# Patient Record
Sex: Male | Born: 1970
Health system: Southern US, Community
[De-identification: ages and names within clinical notes are randomized; demographics above are authoritative.]

## PROBLEM LIST (undated history)

## (undated) HISTORY — PX: CYST EXCISION: SHX5701

---

## 2007-06-04 ENCOUNTER — Emergency Department (HOSPITAL_COMMUNITY): Admission: EM | Admit: 2007-06-04 | Discharge: 2007-06-05 | Payer: Self-pay | Admitting: Emergency Medicine

## 2007-08-30 ENCOUNTER — Ambulatory Visit (HOSPITAL_COMMUNITY): Admission: RE | Admit: 2007-08-30 | Discharge: 2007-08-30 | Payer: Self-pay | Admitting: Internal Medicine

## 2007-09-02 ENCOUNTER — Ambulatory Visit (HOSPITAL_COMMUNITY): Admission: RE | Admit: 2007-09-02 | Discharge: 2007-09-02 | Payer: Self-pay | Admitting: Internal Medicine

## 2008-11-02 IMAGING — CT CT CHEST W/ CM
1 series · 15 of 33 positions shown, 19 images · IV contrast (Omnipaque 300)
Comparison: none

HISTORY: Cough, smoker

[Series 2: chestroutine 5.0 b40f · axial · 0.67mm/px · z∈[-416,-110]mm · 15 of 73 slices shown, 19 images]
[im 6/73  mediastinal]
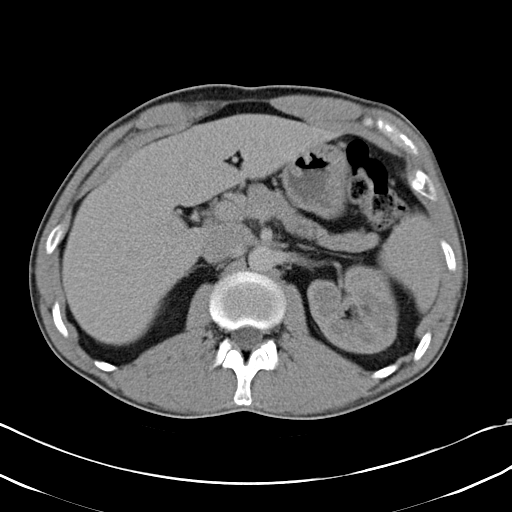
[im 6/73  lung]
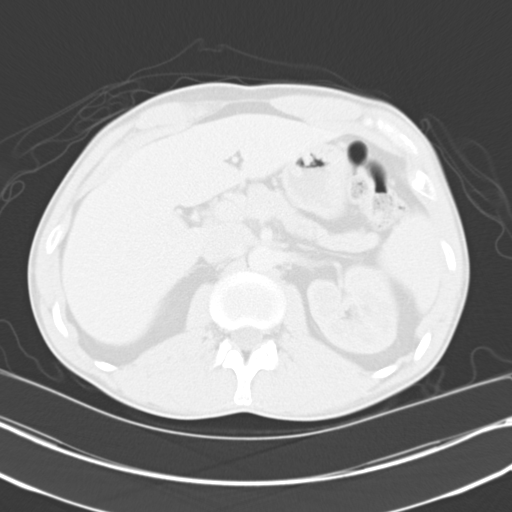
[im 11/73  lung]
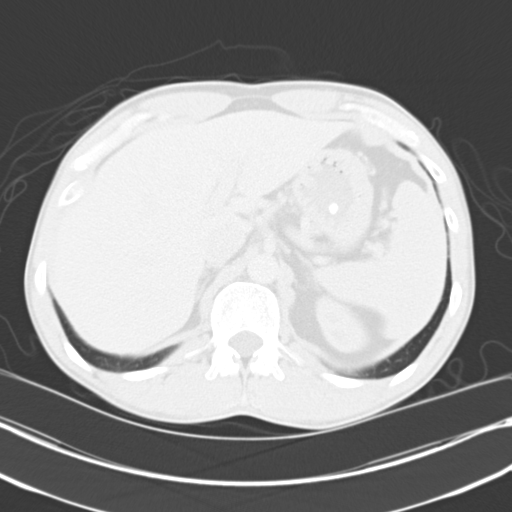
[im 15/73  lung]
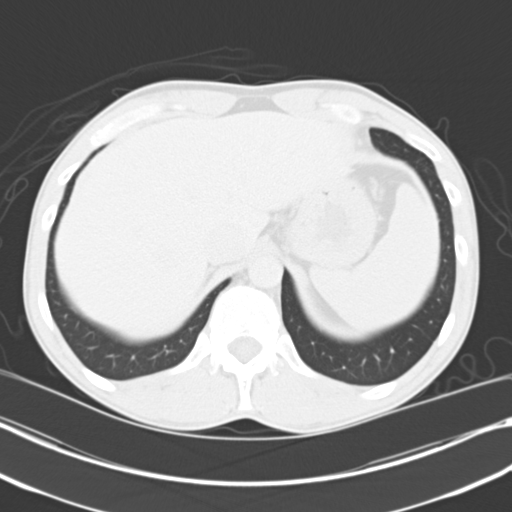
[im 19/73  lung]
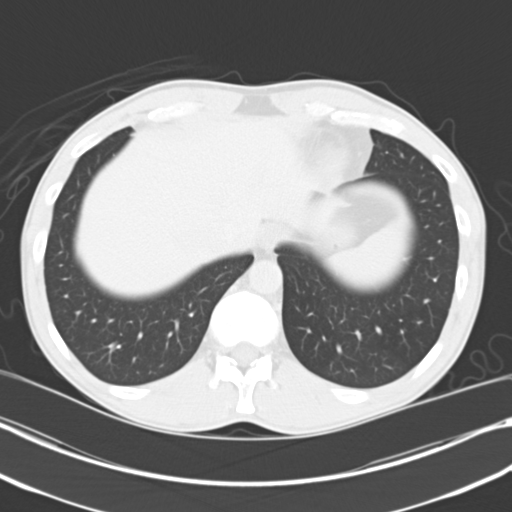
[im 25/73  mediastinal]
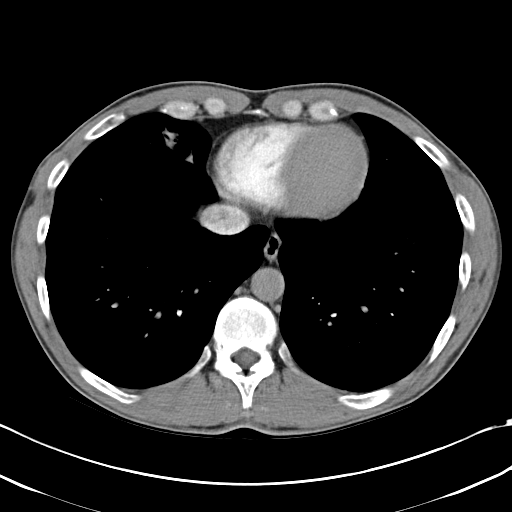
[im 25/73  lung]
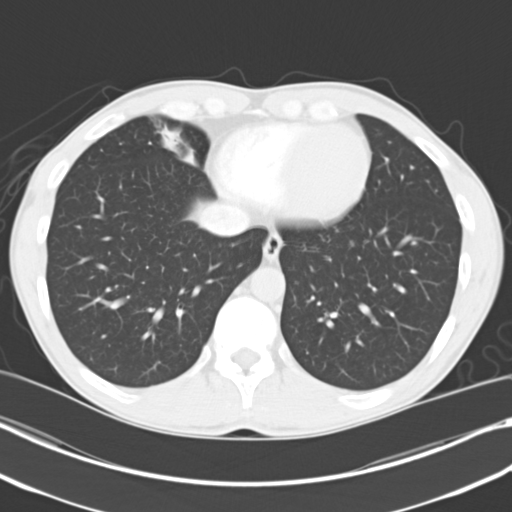
[im 29/73  lung]
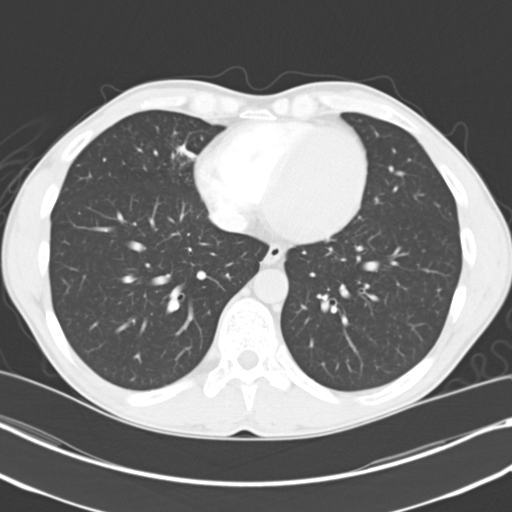
[im 33/73  lung]
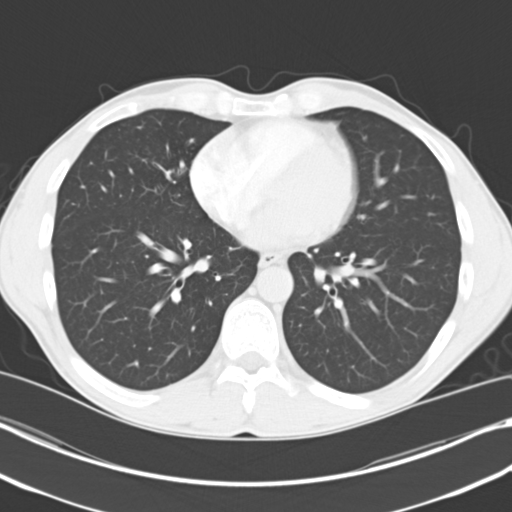
[im 38/73  lung]
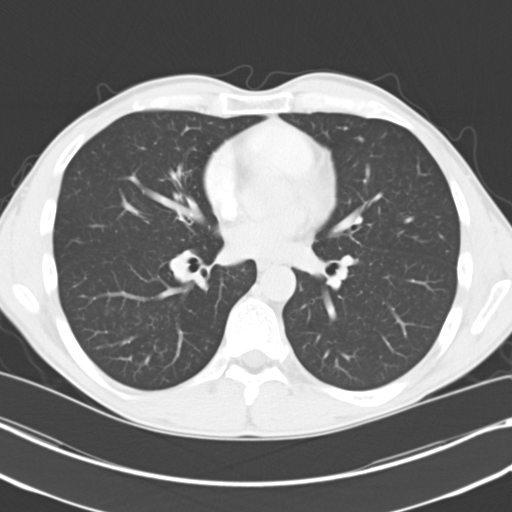
[im 41/73  mediastinal]
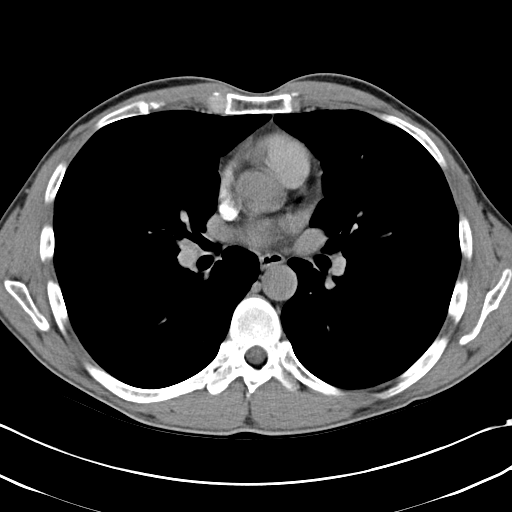
[im 41/73  lung]
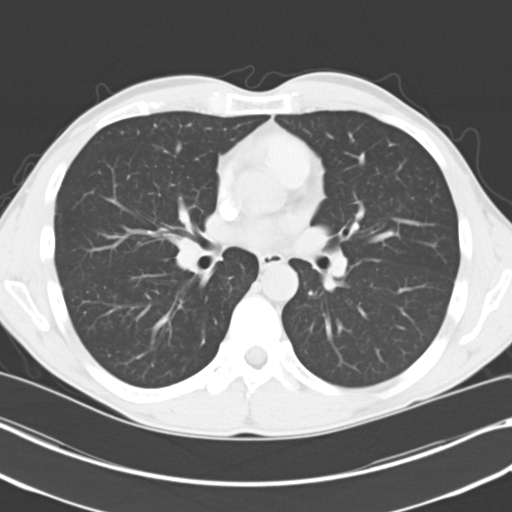
[im 44/73  lung]
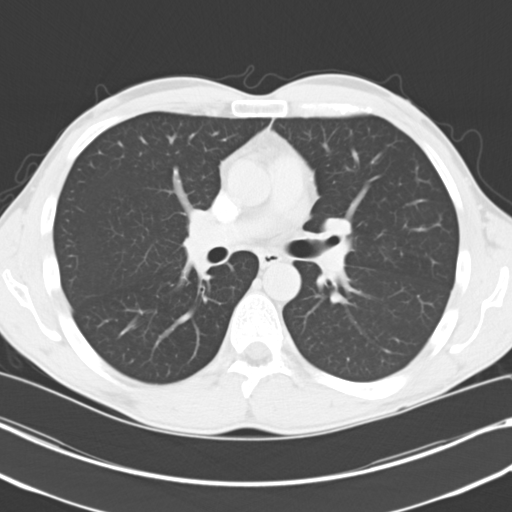
[im 49/73  lung]
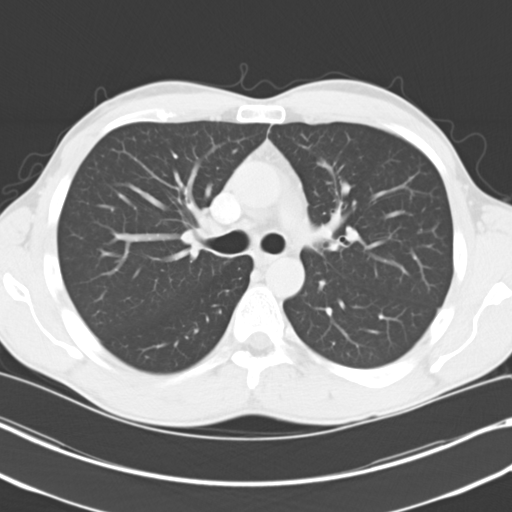
[im 54/73  lung]
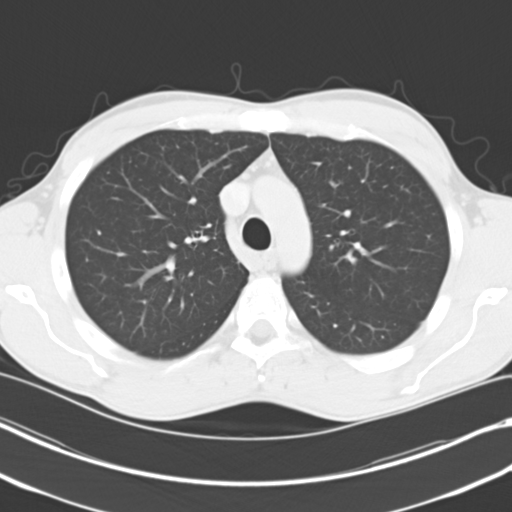
[im 58/73  mediastinal]
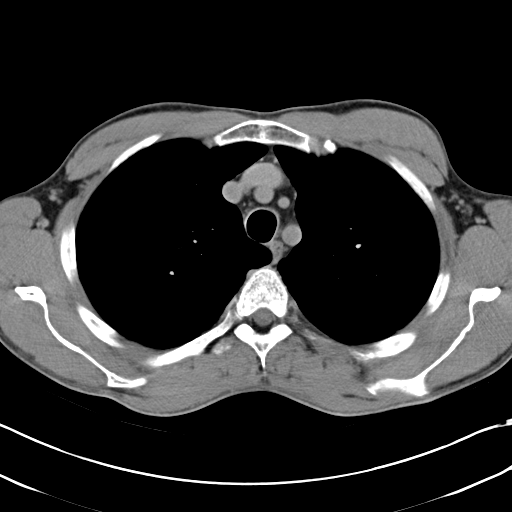
[im 58/73  lung]
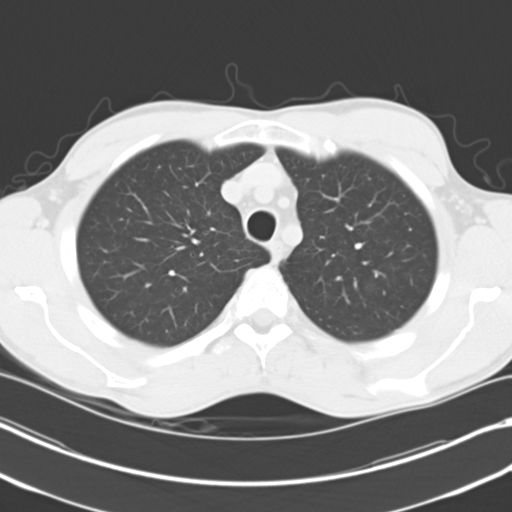
[im 62/73  lung]
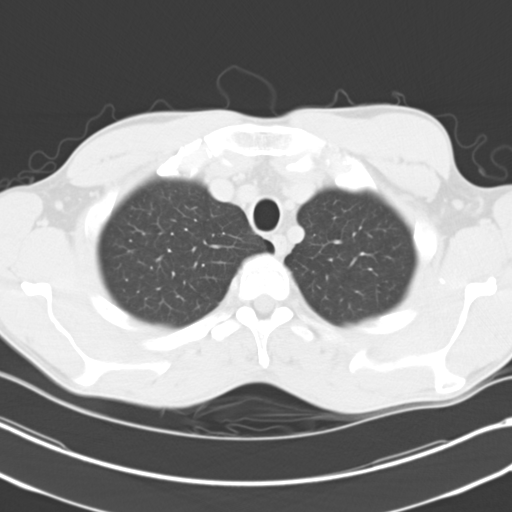
[im 67/73  lung]
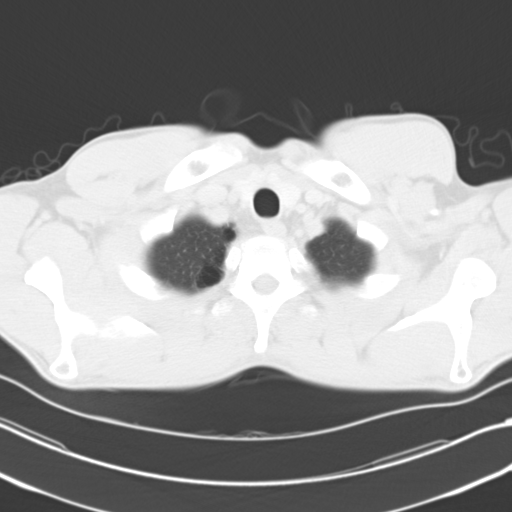

[15 of 33 positions shown; findings below may reference images not displayed]

CT CHEST WITH CONTRAST:

Multidetector helical CT chest performed following 80 cc Xmnipaque-V77.
Sagittal and coronal images reconstructed from axial data set.
Contrast enhancement of thoracic vascular structures is suboptimal.
No contrast extravasation at injection site in left upper extremity.
I suspect the suboptimal contrast enhancement is related to mis-timing of
scanning versus contrast injection, scanning prior to optimal contrast delivery.
No prior exam for comparison. 

Aorta normal caliber.
No enlarged thoracic nodes.
Tiny nonspecific low attenuation focus anterior aspect liver image 54, too small
to characterize.
Tiny cyst posterior aspect right lobe liver 7 mm diameter image 70.
Remaining visualized portion of upper abdomen unremarkable.
Mild atelectasis in medial segment of base of right middle lobe.
Remaining lungs clear.
Bones unremarkable.
IMPRESSION: Minimal infiltrate versus atelectasis medial segment right middle lobe.
Tiny hepatic cyst with additional tiny nonspecific low attenuation focus liver.
No other thoracic abnormalities.

## 2011-11-21 ENCOUNTER — Other Ambulatory Visit (HOSPITAL_COMMUNITY): Payer: Self-pay | Admitting: Orthopaedic Surgery

## 2011-11-21 DIAGNOSIS — R531 Weakness: Secondary | ICD-10-CM

## 2011-11-21 DIAGNOSIS — M25562 Pain in left knee: Secondary | ICD-10-CM

## 2011-11-25 ENCOUNTER — Other Ambulatory Visit (HOSPITAL_COMMUNITY): Payer: Self-pay

## 2013-04-28 ENCOUNTER — Ambulatory Visit (HOSPITAL_COMMUNITY)
Admission: RE | Admit: 2013-04-28 | Discharge: 2013-04-28 | Disposition: A | Discharge status: Home / Self Care | Payer: Managed Care, Other (non HMO) | Source: Ambulatory Visit | Attending: Physician Assistant | Admitting: Physician Assistant

## 2013-04-28 ENCOUNTER — Other Ambulatory Visit (HOSPITAL_COMMUNITY): Payer: Self-pay | Admitting: Physician Assistant

## 2013-04-28 DIAGNOSIS — M25562 Pain in left knee: Secondary | ICD-10-CM

## 2013-04-28 DIAGNOSIS — M8560 Other cyst of bone, unspecified site: Secondary | ICD-10-CM | POA: Insufficient documentation

## 2013-04-28 DIAGNOSIS — M25469 Effusion, unspecified knee: Secondary | ICD-10-CM | POA: Insufficient documentation

## 2013-04-28 DIAGNOSIS — M23329 Other meniscus derangements, posterior horn of medial meniscus, unspecified knee: Secondary | ICD-10-CM | POA: Insufficient documentation

## 2013-04-28 DIAGNOSIS — M21869 Other specified acquired deformities of unspecified lower leg: Secondary | ICD-10-CM

## 2013-07-28 ENCOUNTER — Encounter (HOSPITAL_COMMUNITY): Payer: Self-pay | Admitting: Pharmacy Technician

## 2013-07-28 ENCOUNTER — Encounter (HOSPITAL_COMMUNITY): Payer: Self-pay

## 2013-07-28 ENCOUNTER — Encounter (HOSPITAL_COMMUNITY)
Admission: RE | Admit: 2013-07-28 | Discharge: 2013-07-28 | Disposition: A | Discharge status: Home / Self Care | Payer: Worker's Compensation | Source: Ambulatory Visit | Attending: General Surgery | Admitting: General Surgery

## 2013-07-28 DIAGNOSIS — Z01812 Encounter for preprocedural laboratory examination: Secondary | ICD-10-CM | POA: Insufficient documentation

## 2013-07-28 LAB — CBC
HCT: 43 % (ref 39.0–52.0)
MCHC: 35.3 g/dL (ref 30.0–36.0)
MCV: 92.9 fL (ref 78.0–100.0)
WBC: 8.7 10*3/uL (ref 4.0–10.5)

## 2013-07-28 LAB — BASIC METABOLIC PANEL
BUN: 18 mg/dL (ref 6–23)
Calcium: 10 mg/dL (ref 8.4–10.5)
Chloride: 100 mEq/L (ref 96–112)
Creatinine, Ser: 1.06 mg/dL (ref 0.50–1.35)
Potassium: 4.3 mEq/L (ref 3.5–5.1)

## 2013-07-28 NOTE — Patient Instructions (Signed)
Your procedure is scheduled on: 07/29/2013  Report to [facility] at  8:45   AM.  Call this number if you have problems the morning of surgery: [phone]   Remember:   Do not drink or eat food:After Midnight.  :  Take these medicines the morning of surgery with A SIP OF WATER:    Do not wear jewelry, make-up or nail polish.  Do not wear lotions, powders, or perfumes. You may wear deodorant.  Do not shave 48 hours prior to surgery. Men may shave face and neck.  Do not bring valuables to the hospital.  Contacts, dentures or bridgework may not be worn into surgery.  Leave suitcase in the car. After surgery it may be brought to your room.  For patients admitted to the hospital, checkout time is 11:00 AM the day of discharge.   Patients discharged the day of surgery will not be allowed to drive home.    Special Instructions: Shower using CHG 2 nights before surgery and the night before surgery.  If you shower the day of surgery use CHG.  Use special wash - you have one bottle of CHG for all showers.  You should use approximately 1/3 of the bottle for each shower.   Please read over the following fact sheets that you were given: Pain Booklet, MRSA Information, Surgical Site Infection Prevention and Care and Recovery After Surgery  Hernia Repair Care After These instructions give you information on caring for yourself after your procedure. Your doctor may also give you more specific instructions. Call your doctor if you have any problems or questions after your procedure. HOME CARE   You may have changes in your poops (bowel movements).  You may have loose or watery poop (diarrhea).  You may be not able to poop.  Your bowels will slowly get back to normal.  Do not eat any food that makes you sick to your stomach (nauseous). Eat small meals 4 to 6 times a day instead of 3 large ones.  Do not drink pop. It will give you gas.  Do not drink alcohol.  Do not lift anything heavier than  10 pounds. This is about the weight of a gallon of milk.  Do not do anything that makes you very tired for at least 6 weeks.  Do not get your wound wet for 2 days.  You may take a sponge bath during this time.  After 2 days you may take a shower. Gently pat your surgical cut (incision) dry with a towel. Do not rub it.  For men: You may have been given an athletic supporter (scrotal support) before you left the hospital. It holds your scrotum and testicles closer to your body so there is no strain on your wound. Wear the supporter until your doctor tells you that you do not need it anymore. GET HELP RIGHT AWAY IF:  You have watery poop, or cannot poop for more than 3 days.  You feel sick to your stomach or throw up (vomit) more than 2 or 3 times.  You have temperature by mouth above 102 F (38.9 C).  You see redness or puffiness (swelling) around your wound.  You see yellowish white fluid (pus) coming from your wound.  You see a bulge or bump in your lower belly (abdomen) or near your groin.  You develop a rash, trouble breathing, or any other symptoms from medicines taken. MAKE SURE YOU:  Understand these instructions.  Will watch your condition.  Will get help right away if your are not doing well or get worse. Document Released: 09/11/2008 Document Revised: 12/22/2011 Document Reviewed: 09/11/2008 [facility] Patient Information 2014 [place], [place]. PATIENT INSTRUCTIONS POST-ANESTHESIA  IMMEDIATELY FOLLOWING SURGERY:  Do not drive or operate machinery for the first twenty four hours after surgery.  Do not make any important decisions for twenty four hours after surgery or while taking narcotic pain medications or sedatives.  If you develop intractable nausea and vomiting or a severe headache please notify your doctor immediately.  FOLLOW-UP:  Please make an appointment with your surgeon as instructed. You do not need to follow up with anesthesia unless specifically instructed  to do so.  WOUND CARE INSTRUCTIONS (if applicable):  Keep a dry clean dressing on the anesthesia/puncture wound site if there is drainage.  Once the wound has quit draining you may leave it open to air.  Generally you should leave the bandage intact for twenty four hours unless there is drainage.  If the epidural site drains for more than 36-48 hours please call the anesthesia department.  QUESTIONS?:  Please feel free to call your physician or the hospital operator if you have any questions, and they will be happy to assist you.

## 2013-08-04 ENCOUNTER — Encounter (HOSPITAL_COMMUNITY): Payer: Self-pay

## 2013-08-04 ENCOUNTER — Encounter (HOSPITAL_COMMUNITY)
Admission: RE | Admit: 2013-08-04 | Discharge: 2013-08-04 | Disposition: A | Discharge status: Home / Self Care | Payer: Managed Care, Other (non HMO) | Source: Ambulatory Visit | Attending: General Surgery | Admitting: General Surgery

## 2013-08-10 ENCOUNTER — Encounter (HOSPITAL_COMMUNITY): Payer: Worker's Compensation | Admitting: Anesthesiology

## 2013-08-10 ENCOUNTER — Ambulatory Visit (HOSPITAL_COMMUNITY): Payer: Worker's Compensation | Admitting: Anesthesiology

## 2013-08-10 ENCOUNTER — Encounter (HOSPITAL_COMMUNITY)
Admission: RE | Disposition: A | Discharge status: Home / Self Care | Payer: Self-pay | Source: Ambulatory Visit | Attending: General Surgery

## 2013-08-10 ENCOUNTER — Encounter (HOSPITAL_COMMUNITY): Payer: Self-pay | Admitting: *Deleted

## 2013-08-10 ENCOUNTER — Ambulatory Visit (HOSPITAL_COMMUNITY)
Admission: RE | Admit: 2013-08-10 | Discharge: 2013-08-10 | Disposition: A | Discharge status: Home / Self Care | Payer: Worker's Compensation | Source: Ambulatory Visit | Attending: General Surgery | Admitting: General Surgery

## 2013-08-10 DIAGNOSIS — K4090 Unilateral inguinal hernia, without obstruction or gangrene, not specified as recurrent: Secondary | ICD-10-CM | POA: Insufficient documentation

## 2013-08-10 HISTORY — PX: INGUINAL HERNIA REPAIR: SHX194

## 2013-08-10 HISTORY — PX: INSERTION OF MESH: SHX5868

## 2013-08-10 SURGERY — REPAIR, HERNIA, INGUINAL, ADULT
Anesthesia: General | Site: Groin | Laterality: Left | Wound class: Clean

## 2013-08-10 MED ORDER — BUPIVACAINE LIPOSOME 1.3 % IJ SUSP
20.0000 mL | Freq: Once | INTRAMUSCULAR | Status: AC
  Administered 2013-08-10: 10 mL
  Filled 2013-08-10: qty 20

## 2013-08-10 MED ORDER — CLINDAMYCIN PHOSPHATE 900 MG/50ML IV SOLN: 900.0000 mg | Freq: Once | INTRAVENOUS | Status: DC

## 2013-08-10 MED ORDER — MIDAZOLAM HCL 2 MG/2ML IJ SOLN
INTRAMUSCULAR | Status: AC
  Filled 2013-08-10: qty 2

## 2013-08-10 MED ORDER — FENTANYL CITRATE 0.05 MG/ML IJ SOLN
25.0000 ug | INTRAMUSCULAR | Status: DC | PRN
  Administered 2013-08-10 (×2): 50 ug via INTRAVENOUS
  Filled 2013-08-10: qty 2

## 2013-08-10 MED ORDER — LIDOCAINE HCL 1 % IJ SOLN
INTRAMUSCULAR | Status: DC | PRN
  Administered 2013-08-10: 50 mg via INTRADERMAL

## 2013-08-10 MED ORDER — FENTANYL CITRATE 0.05 MG/ML IJ SOLN
INTRAMUSCULAR | Status: DC | PRN
  Administered 2013-08-10 (×3): 50 ug via INTRAVENOUS
  Administered 2013-08-10: 100 ug via INTRAVENOUS

## 2013-08-10 MED ORDER — BUPIVACAINE HCL (PF) 0.5 % IJ SOLN
INTRAMUSCULAR | Status: AC
  Filled 2013-08-10: qty 30

## 2013-08-10 MED ORDER — PROPOFOL 10 MG/ML IV BOLUS
INTRAVENOUS | Status: DC | PRN
  Administered 2013-08-10: 200 mg via INTRAVENOUS

## 2013-08-10 MED ORDER — MEPERIDINE HCL 50 MG/ML IJ SOLN
INTRAMUSCULAR | Status: AC
  Administered 2013-08-10: 12.5 mg
  Filled 2013-08-10: qty 1

## 2013-08-10 MED ORDER — KETOROLAC TROMETHAMINE 30 MG/ML IJ SOLN
30.0000 mg | Freq: Once | INTRAMUSCULAR | Status: AC
  Administered 2013-08-10: 30 mg via INTRAVENOUS
  Filled 2013-08-10: qty 1

## 2013-08-10 MED ORDER — MEPERIDINE HCL 25 MG/ML IJ SOLN
12.5000 mg | Freq: Once | INTRAMUSCULAR | Status: AC
  Administered 2013-08-10: 12.5 mg via INTRAVENOUS

## 2013-08-10 MED ORDER — MIDAZOLAM HCL 2 MG/2ML IJ SOLN
1.0000 mg | INTRAMUSCULAR | Status: AC | PRN
  Administered 2013-08-10 (×3): 2 mg via INTRAVENOUS
  Filled 2013-08-10 (×2): qty 2

## 2013-08-10 MED ORDER — LIDOCAINE HCL (PF) 1 % IJ SOLN
INTRAMUSCULAR | Status: AC
  Filled 2013-08-10: qty 5

## 2013-08-10 MED ORDER — FENTANYL CITRATE 0.05 MG/ML IJ SOLN
25.0000 ug | INTRAMUSCULAR | Status: AC
  Administered 2013-08-10 (×2): 25 ug via INTRAVENOUS
  Filled 2013-08-10: qty 2

## 2013-08-10 MED ORDER — ONDANSETRON HCL 4 MG/2ML IJ SOLN
4.0000 mg | Freq: Once | INTRAMUSCULAR | Status: AC
  Administered 2013-08-10: 4 mg via INTRAVENOUS
  Filled 2013-08-10: qty 2

## 2013-08-10 MED ORDER — CLINDAMYCIN PHOSPHATE 900 MG/50ML IV SOLN
900.0000 mg | Freq: Once | INTRAVENOUS | Status: AC
  Administered 2013-08-10: 900 mg via INTRAVENOUS

## 2013-08-10 MED ORDER — CLINDAMYCIN PHOSPHATE 900 MG/50ML IV SOLN
INTRAVENOUS | Status: AC
  Filled 2013-08-10: qty 50

## 2013-08-10 MED ORDER — OXYCODONE-ACETAMINOPHEN 7.5-325 MG PO TABS: 1.0000 | ORAL_TABLET | ORAL | Status: DC | PRN

## 2013-08-10 MED ORDER — LACTATED RINGERS IV SOLN
INTRAVENOUS | Status: DC
  Administered 2013-08-10: 12:00:00 via INTRAVENOUS
  Administered 2013-08-10: 1000 mL via INTRAVENOUS

## 2013-08-10 MED ORDER — SODIUM CHLORIDE 0.9 % IR SOLN
Status: DC | PRN
  Administered 2013-08-10: 500 mL

## 2013-08-10 MED ORDER — PROPOFOL 10 MG/ML IV BOLUS
INTRAVENOUS | Status: AC
  Filled 2013-08-10: qty 20

## 2013-08-10 MED ORDER — ONDANSETRON HCL 4 MG/2ML IJ SOLN: 4.0000 mg | Freq: Once | INTRAMUSCULAR | Status: DC | PRN

## 2013-08-10 MED ORDER — FENTANYL CITRATE 0.05 MG/ML IJ SOLN
INTRAMUSCULAR | Status: AC
  Filled 2013-08-10: qty 5

## 2013-08-10 SURGICAL SUPPLY — 39 items
ADH SKN CLS APL DERMABOND .7 (GAUZE/BANDAGES/DRESSINGS) ×1
BAG HAMPER (MISCELLANEOUS) ×2 IMPLANT
CLOTH BEACON ORANGE TIMEOUT ST (SAFETY) ×2 IMPLANT
COVER LIGHT HANDLE STERIS (MISCELLANEOUS) ×4 IMPLANT
DECANTER SPIKE VIAL GLASS SM (MISCELLANEOUS) ×2 IMPLANT
DERMABOND ADVANCED (GAUZE/BANDAGES/DRESSINGS) ×1
DERMABOND ADVANCED .7 DNX12 (GAUZE/BANDAGES/DRESSINGS) ×1 IMPLANT
DRAIN PENROSE 3/4X12 (DRAIN) ×2 IMPLANT
ELECT REM PT RETURN 9FT ADLT (ELECTROSURGICAL) ×2
ELECTRODE REM PT RTRN 9FT ADLT (ELECTROSURGICAL) ×1 IMPLANT
FORMALIN 10 PREFIL 120ML (MISCELLANEOUS) IMPLANT
GLOVE BIO SURGEON STRL SZ7.5 (GLOVE) ×2 IMPLANT
GLOVE ECLIPSE 6.5 STRL STRAW (GLOVE) ×1 IMPLANT
GLOVE EXAM NITRILE MD LF STRL (GLOVE) ×1 IMPLANT
GLOVE INDICATOR 7.0 STRL GRN (GLOVE) ×2 IMPLANT
GLOVE SS BIOGEL STRL SZ 6.5 (GLOVE) IMPLANT
GLOVE SUPERSENSE BIOGEL SZ 6.5 (GLOVE) ×1
GOWN STRL REIN XL XLG (GOWN DISPOSABLE) ×6 IMPLANT
INST SET MINOR GENERAL (KITS) ×2 IMPLANT
KIT ROOM TURNOVER APOR (KITS) ×2 IMPLANT
MANIFOLD NEPTUNE II (INSTRUMENTS) ×2 IMPLANT
MESH MARLEX PLUG MEDIUM (Mesh General) ×1 IMPLANT
NS IRRIG 1000ML POUR BTL (IV SOLUTION) ×2 IMPLANT
PACK MINOR (CUSTOM PROCEDURE TRAY) ×2 IMPLANT
PAD ARMBOARD 7.5X6 YLW CONV (MISCELLANEOUS) ×2 IMPLANT
SET BASIN LINEN APH (SET/KITS/TRAYS/PACK) ×2 IMPLANT
SOL PREP PROV IODINE SCRUB 4OZ (MISCELLANEOUS) ×2 IMPLANT
SUT NOVA NAB GS-22 2 2-0 T-19 (SUTURE) ×3 IMPLANT
SUT NOVAFIL NAB HGS22 2-0 30IN (SUTURE) IMPLANT
SUT SILK 3 0 (SUTURE)
SUT SILK 3-0 18XBRD TIE 12 (SUTURE) IMPLANT
SUT VIC AB 2-0 CT1 27 (SUTURE) ×2
SUT VIC AB 2-0 CT1 TAPERPNT 27 (SUTURE) ×1 IMPLANT
SUT VIC AB 3-0 SH 27 (SUTURE) ×2
SUT VIC AB 3-0 SH 27X BRD (SUTURE) ×1 IMPLANT
SUT VIC AB 4-0 PS2 27 (SUTURE) ×2 IMPLANT
SUT VICRYL AB 3 0 TIES (SUTURE) IMPLANT
SYR CONTROL 10ML LL (SYRINGE) ×2 IMPLANT
TOWEL OR 17X26 4PK STRL BLUE (TOWEL DISPOSABLE) ×2 IMPLANT

## 2013-08-10 NOTE — Op Note (Signed)
Patient:  Sean Meadows  DOB:  April 30, 1971  MRN:  [id number]   Preop Diagnosis:  Left inguinal hernia  Postop Diagnosis:  Same  Procedure:  Left inguinal herniorrhaphy  Surgeon:  Franky Macho, M.D.  Anes:  General  Indications:  Patient is a 42 year old white male who presents with a symptomatic left inguinal hernia. The risks and benefits of the procedure including bleeding, infection, and the possibility of recurrence of the hernia were fully explained to the patient, who gave informed consent.  Procedure note:  The patient is placed in the supine position. After general anesthesia was a minister, the left groin region was prepped and draped using usual sterile technique with Betadine. Surgical site confirmation was performed.  A transverse incision was made in the left groin region down to the external oblique aponeuroses. The process was incised to the external ring. A Penrose drain was placed around the spermatic cord. A positive for this was noted to the spermatic cord. The patient was also noted have significant enlarged varicoceles present. The patient did have an indirect hernia sac. This was freed away from the spermatic cord up to the perineal reflection and inverted. A medium size PerFix plug was then inserted. An onlay patch was then placed along the floor of inguinal canal and secured superiorly to the conjoined tendon and inferiorly to the shelving edge of Poupart's ligament using 2-0 Novafil interrupted sutures. The internal ring was recreated using a 2-0 Novafil interrupted suture. The external oblique aponeuroses was reapproximated using a 2-0 Vicryl running suture. The subcutaneous layer was reapproximated using 3-0 Vicryl interrupted suture. The skin was closed a 4-0 Vicryl subcuticular suture. Exparel was instilled in the surrounding wound. Dermabond was then applied.  All tape and needle counts were correct at the end of the procedure. The patient was awakened and  transferred to PACU in stable condition.  Complications:  None  EBL:  Minimal  Specimen:  None

## 2013-08-10 NOTE — Transfer of Care (Signed)
Immediate Anesthesia Transfer of Care Note  Patient: Sean Meadows  Procedure(s) Performed: Procedure(s): HERNIA REPAIR INGUINAL ADULT (Left) INSERTION OF MESH (Left)  Patient Location: PACU  Anesthesia Type:General  Level of Consciousness: awake  Airway & Oxygen Therapy: Patient Spontanous Breathing and Patient connected to face mask oxygen  Post-op Assessment: Report given to PACU RN and Post -op Vital signs reviewed and stable  Post vital signs: Reviewed and stable  Complications: No apparent anesthesia complications

## 2013-08-10 NOTE — Interval H&P Note (Signed)
History and Physical Interval Note:  08/10/2013 11:50 AM  Sean Meadows  has presented today for surgery, with the diagnosis of left inguinal hernia  The various methods of treatment have been discussed with the patient and family. After consideration of risks, benefits and other options for treatment, the patient has consented to  Procedure(s): HERNIA REPAIR INGUINAL ADULT (Left) as a surgical intervention .  The patient's history has been reviewed, patient examined, no change in status, stable for surgery.  I have reviewed the patient's chart and labs.  Questions were answered to the patient's satisfaction.     Franky Macho A

## 2013-08-10 NOTE — Preoperative (Signed)
Beta Blockers   Reason not to administer Beta Blockers:Not Applicable 

## 2013-08-10 NOTE — H&P (Signed)
Dr. Franky Macho will be performing the left inguinal herniorrhaphy. Dr. Helyn App. available. I have seen the patient preoperatively and he agrees to the treatment plan. The risks and benefits of the procedure were fully explained to the patient, who gave informed consent.

## 2013-08-10 NOTE — Anesthesia Postprocedure Evaluation (Signed)
  Anesthesia Post-op Note  Patient: Sean Meadows  Procedure(s) Performed: Procedure(s): HERNIA REPAIR INGUINAL ADULT (Left) INSERTION OF MESH (Left)  Patient Location: PACU  Anesthesia Type:General  Level of Consciousness: awake and alert   Airway and Oxygen Therapy: Patient Spontanous Breathing and Patient connected to face mask oxygen  Post-op Pain: none  Post-op Assessment: Post-op Vital signs reviewed, Patient's Cardiovascular Status Stable, Respiratory Function Stable, Patent Airway and No signs of Nausea or vomiting  Post-op Vital Signs: Reviewed and stable  Complications: No apparent anesthesia complications

## 2013-08-10 NOTE — Anesthesia Preprocedure Evaluation (Signed)
Anesthesia Evaluation  Patient identified by MRN, date of birth, ID band Patient awake    Reviewed: Allergy & Precautions, H&P , NPO status , Patient's Chart, lab work & pertinent test results  Airway Mallampati: I TM Distance: >3 FB Neck ROM: Full    Dental  (+) Teeth Intact   Pulmonary neg pulmonary ROS, Current Smoker,  breath sounds clear to auscultation        Cardiovascular negative cardio ROS  Rhythm:Regular Rate:Normal     Neuro/Psych negative psych ROS   GI/Hepatic negative GI ROS,   Endo/Other    Renal/GU      Musculoskeletal   Abdominal   Peds  Hematology   Anesthesia Other Findings   Reproductive/Obstetrics                           Anesthesia Physical Anesthesia Plan  ASA: I  Anesthesia Plan: General   Post-op Pain Management:    Induction: Intravenous  Airway Management Planned: LMA  Additional Equipment:   Intra-op Plan:   Post-operative Plan: Extubation in OR  Informed Consent: I have reviewed the patients History and Physical, chart, labs and discussed the procedure including the risks, benefits and alternatives for the proposed anesthesia with the patient or authorized representative who has indicated his/her understanding and acceptance.     Plan Discussed with:   Anesthesia Plan Comments:         Anesthesia Quick Evaluation

## 2013-08-12 ENCOUNTER — Encounter (HOSPITAL_COMMUNITY): Payer: Self-pay | Admitting: General Surgery

## 2013-08-12 NOTE — H&P (Signed)
NTS SOAP Note  Vital Signs:  Vitals as of: 07/21/2013: Systolic 123: Diastolic 66: Heart Rate 83: Temp 99.71F: Height 25ft 2in: Weight 169Lbs 0 Ounces: Pain Level 2: BMI 21.7  BMI : 21.7 kg/m2  Subjective: This 42 Years 12 Months old Male presents for of  discomfort and bulge in the left groin. Patient noted approximately 2 weeks ago while working a pulling sensation in the groin. After that time he noted the bulging. He denies any prior issues with bulging or discomfort. She denies any tearing or popping sensation. No change in bowel habits. No nausea. No signs or symptoms of incarceration or strangulation.  Review of Symptoms:  Constitutional:unremarkable   Head:unremarkable    Eyes:unremarkable   Nose/Mouth/Throat:unremarkable Cardiovascular:  unremarkable   Respiratory:unremarkable   Gastrointestinal:  unremarkable   As per history of present illness Genitourinary:unremarkable     Musculoskeletal:unremarkable   Skin:unremarkable Breast:unremarkable   Hematolgic/Lymphatic:unremarkable     Allergic/Immunologic:unremarkable     Past Medical History:  Obtained     Past Medical History  Surgical History: none Medical Problems: none Psychiatric History: none Allergies: no known drug allergies Medications: none   Social History:Obtained  Social History  Preferred Language: English Race:  White Ethnicity: Not Hispanic / Latino Age: 42 Years 0 Months Marital Status:  S Alcohol: none Recreational drug(s): denies   Smoking Status: Current every day smoker reviewed on 07/22/2013 Started Date: 10/13/1990 Packs per day: 1.00 Functional Status reviewed on mm/dd/yyyy ------------------------------------------------ Bathing: Normal Cooking: Normal Dressing: Normal Driving: Normal Eating: Normal Managing Meds: Normal Oral Care: Normal Shopping: Normal Toileting: Normal Transferring: Normal Walking: Normal Cognitive Status  reviewed on mm/dd/yyyy ------------------------------------------------ Attention: Normal Decision Making: Normal Language: Normal Memory: Normal Motor: Normal Perception: Normal Problem Solving: Normal Visual and Spatial: Normal   Family History:Obtained    Family Health History Mother  Father, Living; Healthy; cancer    Objective Information: General:  Well appearing, well nourished in no distress. Skin:     no rash or prominent lesions Head:Atraumatic; no masses; no abnormalities Eyes:  conjunctiva clear, EOM intact, PERRL Mouth:  Mucous membranes moist, no mucosal lesions. Neck:  Supple without lymphadenopathy.  Heart:  RRR, no murmur Lungs:    CTA bilaterally, no wheezes, rhonchi, rales.  Breathing unlabored. Abdomen:Soft, NT/ND, no HSM, no masses.   reducible left inguinal hernia.    Normal external male genitalia bilateral descended testicles.  Assessment:    Plan:  Left angle hernia. Risks benefits alternatives of care have been discussed. Signs and symptoms of incarceration have been discussed as well patient understands that should any symptoms occur he should proceed to the emergency department. Scheduling will be based upon patient and patient's employer's convenience.  Patient Education:Alternative treatments to surgery were discussed with patient (and family).  Risks and benefits  of procedure were fully explained to the patient (and family) who gave informed consent. Patient/family questions were addressed.  Follow-up:Pending Surgery

## 2013-09-23 ENCOUNTER — Other Ambulatory Visit (HOSPITAL_COMMUNITY): Payer: Self-pay | Admitting: Family Medicine

## 2013-09-23 ENCOUNTER — Ambulatory Visit (HOSPITAL_COMMUNITY)
Admission: RE | Admit: 2013-09-23 | Discharge: 2013-09-23 | Disposition: A | Discharge status: Home / Self Care | Payer: Managed Care, Other (non HMO) | Source: Ambulatory Visit | Attending: Family Medicine | Admitting: Family Medicine

## 2013-09-23 DIAGNOSIS — M545 Low back pain, unspecified: Secondary | ICD-10-CM | POA: Insufficient documentation

## 2017-10-02 DIAGNOSIS — J069 Acute upper respiratory infection, unspecified: Secondary | ICD-10-CM | POA: Diagnosis not present

## 2017-10-02 DIAGNOSIS — Z6822 Body mass index (BMI) 22.0-22.9, adult: Secondary | ICD-10-CM | POA: Diagnosis not present

## 2017-10-02 DIAGNOSIS — Z719 Counseling, unspecified: Secondary | ICD-10-CM | POA: Diagnosis not present

## 2017-11-11 DIAGNOSIS — Z6822 Body mass index (BMI) 22.0-22.9, adult: Secondary | ICD-10-CM | POA: Diagnosis not present

## 2017-11-11 DIAGNOSIS — J301 Allergic rhinitis due to pollen: Secondary | ICD-10-CM | POA: Diagnosis not present

## 2017-11-11 DIAGNOSIS — J019 Acute sinusitis, unspecified: Secondary | ICD-10-CM | POA: Diagnosis not present

## 2018-07-23 DIAGNOSIS — L723 Sebaceous cyst: Secondary | ICD-10-CM | POA: Diagnosis not present

## 2018-07-23 DIAGNOSIS — D229 Melanocytic nevi, unspecified: Secondary | ICD-10-CM | POA: Diagnosis not present

## 2020-06-30 ENCOUNTER — Emergency Department (HOSPITAL_COMMUNITY)
Admission: EM | Admit: 2020-06-30 | Discharge: 2020-06-30 | Disposition: A | Discharge status: Home / Self Care | Payer: No Typology Code available for payment source | Attending: Emergency Medicine | Admitting: Emergency Medicine

## 2020-06-30 ENCOUNTER — Ambulatory Visit (INDEPENDENT_AMBULATORY_CARE_PROVIDER_SITE_OTHER): Payer: No Typology Code available for payment source

## 2020-06-30 ENCOUNTER — Other Ambulatory Visit: Payer: Self-pay

## 2020-06-30 ENCOUNTER — Encounter (HOSPITAL_COMMUNITY): Payer: Self-pay | Admitting: Emergency Medicine

## 2020-06-30 ENCOUNTER — Ambulatory Visit
Admission: EM | Admit: 2020-06-30 | Discharge: 2020-06-30 | Disposition: A | Discharge status: Home / Self Care | Payer: 59 | Attending: Emergency Medicine | Admitting: Emergency Medicine

## 2020-06-30 DIAGNOSIS — S6991XA Unspecified injury of right wrist, hand and finger(s), initial encounter: Secondary | ICD-10-CM

## 2020-06-30 DIAGNOSIS — S61111A Laceration without foreign body of right thumb with damage to nail, initial encounter: Secondary | ICD-10-CM

## 2020-06-30 DIAGNOSIS — F17210 Nicotine dependence, cigarettes, uncomplicated: Secondary | ICD-10-CM | POA: Insufficient documentation

## 2020-06-30 DIAGNOSIS — S61319A Laceration without foreign body of unspecified finger with damage to nail, initial encounter: Secondary | ICD-10-CM

## 2020-06-30 DIAGNOSIS — W231XXA Caught, crushed, jammed, or pinched between stationary objects, initial encounter: Secondary | ICD-10-CM | POA: Insufficient documentation

## 2020-06-30 MED ORDER — LIDOCAINE HCL (PF) 1 % IJ SOLN
30.0000 mL | Freq: Once | INTRAMUSCULAR | Status: AC
  Administered 2020-06-30: 30 mL
  Filled 2020-06-30: qty 30

## 2020-06-30 NOTE — ED Notes (Signed)
Pt discharge instructions reviewed with the patient. The patient verbalized understanding of instructions. Pt discharged. 

## 2020-06-30 NOTE — ED Triage Notes (Signed)
Pt sent from [facility] in [place] for nailbed laceration to R thumb.  States he smashed it in a machine at work around [place] completed at [facility].  Coban dressing in place.

## 2020-06-30 NOTE — Discharge Instructions (Signed)
X-rays negative for fracture However, based on location of laceration to nail bed I am recommending further evaluation and management in the ED.  Patient aware and in agreement with plan.  Will go by private vehicle.

## 2020-06-30 NOTE — ED Provider Notes (Signed)
[facility]   [id number] 06/30/20 Arrival Time: 57  CC: LACERATION  SUBJECTIVE:  Sean Meadows is a 49 y.o. male who presents with a laceration to RT thumb that occurred today.  Symptoms began after smashing thumb in cigarette machine.  Bleeding controlled.  Denies similar symptoms in the past.  Denies fever, chills, nausea, vomiting, swelling.    Td UTD: Yes.  ROS: As per HPI.  All other pertinent ROS negative.     History reviewed. No pertinent past medical history. Past Surgical History:  Procedure Laterality Date  . CYST EXCISION     from lower groin  . INGUINAL HERNIA REPAIR Left 08/10/2013   Procedure: HERNIA REPAIR INGUINAL ADULT;  Surgeon: Jamesetta So, MD;  Location: [facility];  Service: General;  Laterality: Left;  . INSERTION OF MESH Left 08/10/2013   Procedure: INSERTION OF MESH;  Surgeon: Jamesetta So, MD;  Location: [facility];  Service: General;  Laterality: Left;   Allergies  Allergen Reactions  . Penicillins Other (See Comments)    Childhood Reaction    No current facility-administered medications on file prior to encounter.   Current Outpatient Medications on File Prior to Encounter  Medication Sig Dispense Refill  . oxyCODONE-acetaminophen (PERCOCET) 7.5-325 MG per tablet Take 1-2 tablets by mouth every 4 (four) hours as needed. 50 tablet 0   Social History   Socioeconomic History  . Marital status: Married    Spouse name: Not on file  . Number of children: Not on file  . Years of education: Not on file  . Highest education level: Not on file  Occupational History  . Not on file  Tobacco Use  . Smoking status: Current Every Day Smoker    Packs/day: 1.00    Years: 30.00    Pack years: 30.00    Types: Cigarettes  Substance and Sexual Activity  . Alcohol use: No  . Drug use: No  . Sexual activity: Yes    Birth control/protection: None  Other Topics Concern  . Not on file  Social History Narrative  . Not on file   Social  Determinants of Health   Financial Resource Strain:   . Difficulty of Paying Living Expenses: Not on file  Food Insecurity:   . Worried About Charity fundraiser in the Last Year: Not on file  . Ran Out of Food in the Last Year: Not on file  Transportation Needs:   . Lack of Transportation (Medical): Not on file  . Lack of Transportation (Non-Medical): Not on file  Physical Activity:   . Days of Exercise per Week: Not on file  . Minutes of Exercise per Session: Not on file  Stress:   . Feeling of Stress : Not on file  Social Connections:   . Frequency of Communication with Friends and Family: Not on file  . Frequency of Social Gatherings with Friends and Family: Not on file  . Attends Religious Services: Not on file  . Active Member of Clubs or Organizations: Not on file  . Attends Archivist Meetings: Not on file  . Marital Status: Not on file  Intimate Partner Violence:   . Fear of Current or Ex-Partner: Not on file  . Emotionally Abused: Not on file  . Physically Abused: Not on file  . Sexually Abused: Not on file   Family History  Family history unknown: Yes     OBJECTIVE:  Vitals:   06/30/20 1035  BP: 131/84  Pulse:  80  Resp: 17  Temp: 98.3 F (36.8 C)  TempSrc: Tympanic  SpO2: 94%    General appearance: alert; no distress Skin: RT thumb nail avulsion with nailbed laceration, bleeding Psychological: alert and cooperative; normal mood and affect  DIAGNOSTIC STUDIES:  DG Hand Complete Right  Result Date: 06/30/2020 CLINICAL DATA:  Right hand injury at work this morning. EXAM: RIGHT HAND - COMPLETE 3+ VIEW COMPARISON:  None. FINDINGS: There is no evidence of fracture or dislocation. There is no evidence of arthropathy or other focal bone abnormality. Soft tissues are unremarkable. IMPRESSION: No acute findings. Electronically Signed   By: Marin Olp M.D.   On: 06/30/2020 11:02   X-rays negative for bony abnormalities including fracture, or  dislocation.  No soft tissue swelling.    I have reviewed the x-rays myself and the radiologist interpretation. I am in agreement with the radiologist interpretation.     ASSESSMENT & PLAN:  1. Nailbed laceration, finger, initial encounter     X-rays negative for fracture However, based on location of laceration to nail bed I am recommending further evaluation and management in the ED.  Patient aware and in agreement with plan.  Will go by private vehicle.   Lestine Box, PA-C 06/30/20 1124

## 2020-06-30 NOTE — ED Provider Notes (Signed)
[facility] Emergency Department Provider Note MRN:  [id number]  Arrival date & time: 06/30/20     Chief Complaint   thumb injury   History of Present Illness   Sean Meadows is a 49 y.o. year-old male with no pertinent past medical history presenting to the ED with chief complaint of thumb injury.  Patient sent from urgent care for nailbed injury to the right thumb.  Was at work and had a machine smashed the tip of the right thumb.  Incident occurred at 4 AM.  Up-to-date on tetanus.  Washed in the urgent care suite, sent here for nailbed repair.  Denies any other injuries, pain is mild, constant, worse with motion or palpation.  Review of Systems  A complete 10 system review of systems was obtained and all systems are negative except as noted in the HPI and PMH.   Patient's Health History   History reviewed. No pertinent past medical history.  Past Surgical History:  Procedure Laterality Date   CYST EXCISION     from lower groin   INGUINAL HERNIA REPAIR Left 08/10/2013   Procedure: HERNIA REPAIR INGUINAL ADULT;  Surgeon: Jamesetta So, MD;  Location: [facility];  Service: General;  Laterality: Left;   INSERTION OF MESH Left 08/10/2013   Procedure: INSERTION OF MESH;  Surgeon: Jamesetta So, MD;  Location: [facility];  Service: General;  Laterality: Left;    Family History  Family history unknown: Yes    Social History   Socioeconomic History   Marital status: Married    Spouse name: Not on file   Number of children: Not on file   Years of education: Not on file   Highest education level: Not on file  Occupational History   Not on file  Tobacco Use   Smoking status: Current Every Day Smoker    Packs/day: 1.00    Years: 30.00    Pack years: 30.00    Types: Cigarettes   Smokeless tobacco: Never Used  Substance and Sexual Activity   Alcohol use: No   Drug use: No   Sexual activity: Yes    Birth control/protection: None  Other Topics  Concern   Not on file  Social History Narrative   Not on file   Social Determinants of Health   Financial Resource Strain:    Difficulty of Paying Living Expenses: Not on file  Food Insecurity:    Worried About Charity fundraiser in the Last Year: Not on file   [employer] of Food in the Last Year: Not on file  Transportation Needs:    Lack of Transportation (Medical): Not on file   Lack of Transportation (Non-Medical): Not on file  Physical Activity:    Days of Exercise per Week: Not on file   Minutes of Exercise per Session: Not on file  Stress:    Feeling of Stress : Not on file  Social Connections:    Frequency of Communication with Friends and Family: Not on file   Frequency of Social Gatherings with Friends and Family: Not on file   Attends Religious Services: Not on file   Active Member of Clubs or Organizations: Not on file   Attends Archivist Meetings: Not on file   Marital Status: Not on file  Intimate Partner Violence:    Fear of Current or Ex-Partner: Not on file   Emotionally Abused: Not on file   Physically Abused: Not on file   Sexually Abused:  Not on file     Physical Exam   Vitals:   06/30/20 1230  BP: (!) 133/109  Pulse: 81  Resp: 14  Temp: 98.3 F (36.8 C)  SpO2: 100%    CONSTITUTIONAL: Well-appearing, NAD NEURO:  Alert and oriented x 3, no focal deficits EYES:  eyes equal and reactive ENT/NECK:  no LAD, no JVD CARDIO: Regular rate, well-perfused, normal S1 and S2 PULM:  CTAB no wheezing or rhonchi GI/GU:  normal bowel sounds, non-distended, non-tender MSK/SPINE:  No gross deformities, no edema SKIN: Near complete nail avulsion to the right thumb with underlying nailbed lacerations PSYCH:  Appropriate speech and behavior  *Additional and/or pertinent findings included in MDM below  Diagnostic and Interventional Summary    EKG Interpretation  Date/Time:    Ventricular Rate:    PR Interval:    QRS Duration:    QT Interval:    QTC Calculation:   R Axis:     Text Interpretation:        Labs Reviewed - No data to display  No orders to display    Medications  lidocaine (PF) (XYLOCAINE) 1 % injection 30 mL (30 mLs Other Given 06/30/20 1345)     Procedures  /  Critical Care .Marland KitchenLaceration Repair  Date/Time: 06/30/2020 3:01 PM Performed by: Maudie Flakes, MD Authorized by: Maudie Flakes, MD   Consent:    Consent obtained:  Verbal   Consent given by:  Patient   Risks discussed:  Infection, need for additional repair, nerve damage, poor cosmetic result, pain, poor wound healing, retained foreign body, tendon damage and vascular damage Anesthesia (see MAR for exact dosages):    Anesthesia method:  Nerve block   Block needle gauge:  27 G   Block anesthetic:  Lidocaine 1% w/o epi   Block injection procedure:  Anatomic landmarks identified and anatomic landmarks palpated   Block outcome:  Anesthesia achieved Laceration details:    Location: Right thumb.   Length (cm):  2   Depth (mm):  1 Repair type:    Repair type:  Complex Pre-procedure details:    Preparation:  Patient was prepped and draped in usual sterile fashion Exploration:    Hemostasis achieved with:  Tourniquet   Wound exploration: wound explored through full range of motion and entire depth of wound probed and visualized     Contaminated: no   Treatment:    Area cleansed with:  Saline   Amount of cleaning:  Standard   Debridement:  Minimal   Undermining:  Minimal Subcutaneous repair:    Suture size:  5-0   Suture material:  Chromic gut   Suture technique:  Simple interrupted   Number of sutures:  2 Skin repair:    Repair method:  Sutures   Suture size:  5-0   Suture material:  Chromic gut   Suture technique:  Simple interrupted   Number of sutures:  4 Approximation:    Approximation:  Close Post-procedure details:    Dressing:  Non-adherent dressing   Patient tolerance of procedure:  Tolerated well, no  immediate complications Comments:     Digital nerve block applied so that nail could be fully removed, revealing 2 small nailbed lacerations repaired with absorbable sutures.  The edges of the nailbed also required suture repair.  Tourniquet was utilized to control bleeding during the procedure.  After repair of the sutures, the nail was reapplied within the nail fold/matrix and adhered to the nailbed with Dermabond.  ED Course and Medical Decision Making  I have reviewed the triage vital signs, the nursing notes, and pertinent available records from the EMR.  Listed above are laboratory and imaging tests that I personally ordered, reviewed, and interpreted and then considered in my medical decision making (see below for details).  Nailbed laceration as described above, no other injuries, appropriate for discharge.       Barth Kirks. Sedonia Small, Humphreys [email or web address]  Final Clinical Impressions(s) / ED Diagnoses     ICD-10-CM   1. Laceration of right thumb without foreign body with damage to nail, initial encounter  B15.176H     ED Discharge Orders    None       Discharge Instructions Discussed with and Provided to Patient:     Discharge Instructions     You were evaluated in the Emergency Department and after careful evaluation, we did not find any emergent condition requiring admission or further testing in the hospital.  Your exam/testing today was overall reassuring.  We repaired your nailbed laceration here in the emergency department.  As discussed, you will need to protect this hand and thumb for the next week to avoid aggravating the nailbed repair.  Use Tylenol or Motrin for discomfort.  We used absorbable sutures today and so you do not need any sutures removed.  As discussed, please follow-up with the hand specialist if you have concerns with how the nail or thumb heals.  Please return to the Emergency  Department if you experience any worsening of your condition.  Thank you for allowing [place] to be a part of your care.       Maudie Flakes, MD 06/30/20 [id number]

## 2020-06-30 NOTE — Discharge Instructions (Addendum)
You were evaluated in the Emergency Department and after careful evaluation, we did not find any emergent condition requiring admission or further testing in the hospital.  Your exam/testing today was overall reassuring.  We repaired your nailbed laceration here in the emergency department.  As discussed, you will need to protect this hand and thumb for the next week to avoid aggravating the nailbed repair.  Use Tylenol or Motrin for discomfort.  We used absorbable sutures today and so you do not need any sutures removed.  As discussed, please follow-up with the hand specialist if you have concerns with how the nail or thumb heals.  Please return to the Emergency Department if you experience any worsening of your condition.  Thank you for allowing [place] to be a part of your care.

## 2020-06-30 NOTE — ED Triage Notes (Signed)
Pt injured right thumb at work when cigarette machine crushed thumb, bleeding controlled, laceration at base of thumb nail

## 2021-08-31 IMAGING — DX DG HAND COMPLETE 3+V*R*
3 series · 3 of 3 positions shown · non-contrast
Comparison: None.

CLINICAL DATA: Right hand injury at work this morning.

EXAM:
RIGHT HAND - COMPLETE 3+ VIEW

[hand pa]
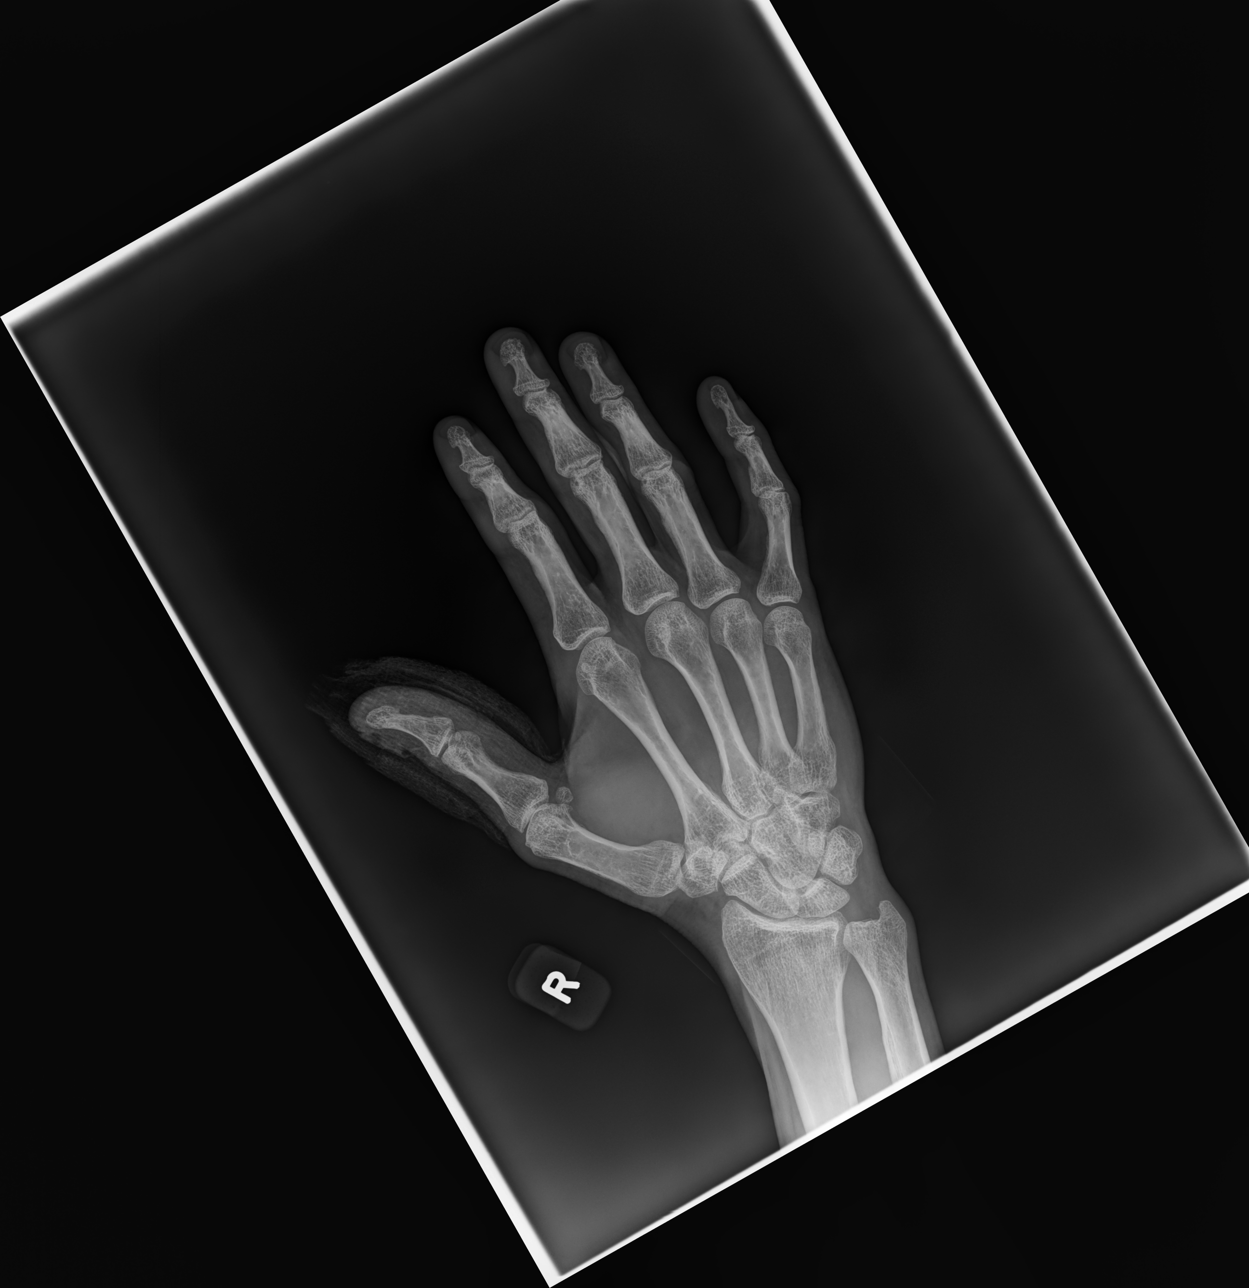

[hand mlo]
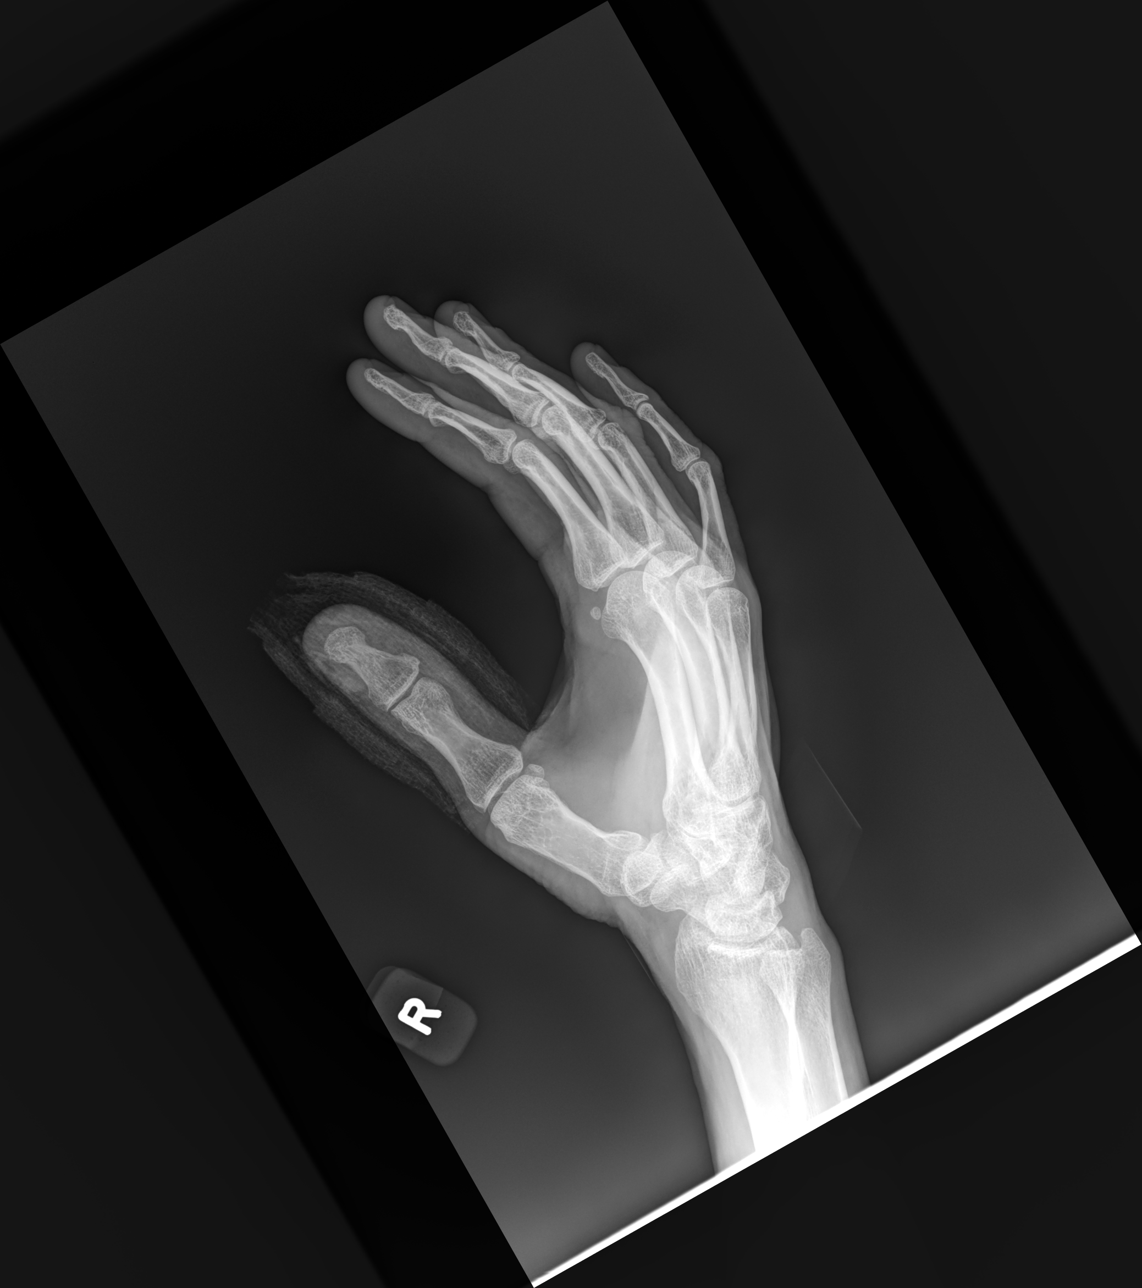

[hand lat]
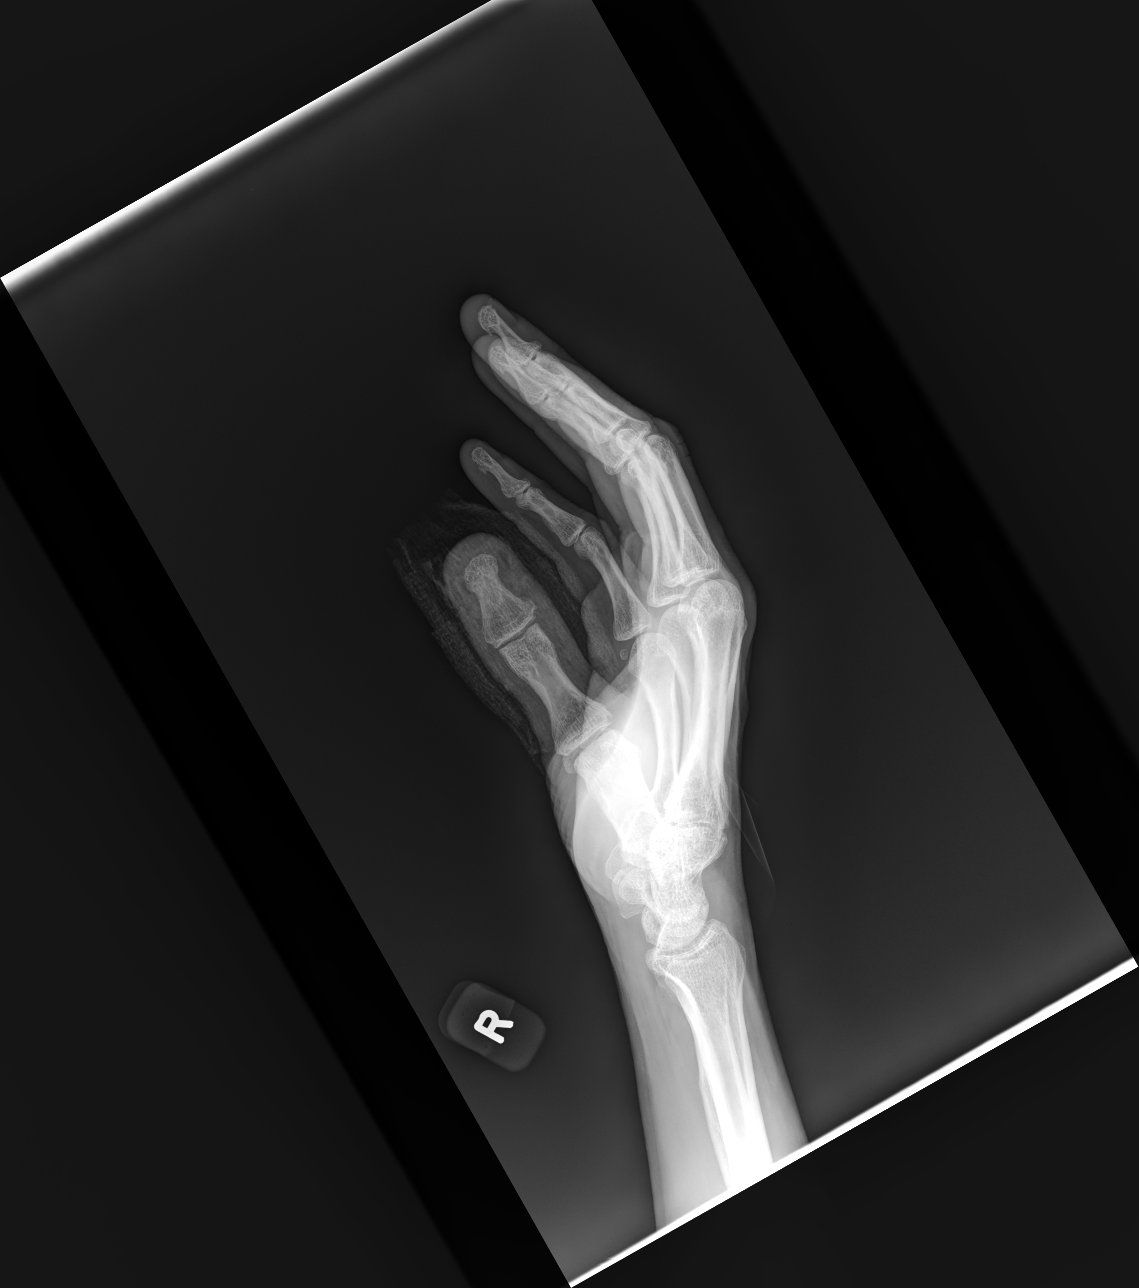

[3 of 3 positions shown; findings below may reference images not displayed]

FINDINGS: There is no evidence of fracture or dislocation. There is no
evidence of arthropathy or other focal bone abnormality. Soft
tissues are unremarkable.
IMPRESSION: No acute findings.
